# Patient Record
Sex: Male | Born: 1990 | Race: Black or African American | Hispanic: No | Marital: Single | Smoking: Never smoker
Health system: Southern US, Community
[De-identification: ages and names within clinical notes are randomized; demographics above are authoritative.]

---

## 2009-02-21 ENCOUNTER — Emergency Department (HOSPITAL_COMMUNITY): Admission: EM | Admit: 2009-02-21 | Discharge: 2009-02-21 | Payer: Self-pay | Admitting: Emergency Medicine

## 2009-05-17 ENCOUNTER — Emergency Department (HOSPITAL_COMMUNITY): Admission: EM | Admit: 2009-05-17 | Discharge: 2009-05-17 | Payer: Self-pay | Admitting: Emergency Medicine

## 2015-05-05 ENCOUNTER — Emergency Department (HOSPITAL_COMMUNITY)
Admission: EM | Admit: 2015-05-05 | Discharge: 2015-05-06 | Disposition: A | Discharge status: Home / Self Care | Payer: Self-pay | Attending: Emergency Medicine | Admitting: Emergency Medicine

## 2015-05-05 ENCOUNTER — Encounter (HOSPITAL_COMMUNITY): Payer: Self-pay | Admitting: *Deleted

## 2015-05-05 ENCOUNTER — Emergency Department (HOSPITAL_COMMUNITY): Payer: Self-pay

## 2015-05-05 DIAGNOSIS — Y92410 Unspecified street and highway as the place of occurrence of the external cause: Secondary | ICD-10-CM | POA: Insufficient documentation

## 2015-05-05 DIAGNOSIS — S81012A Laceration without foreign body, left knee, initial encounter: Secondary | ICD-10-CM | POA: Insufficient documentation

## 2015-05-05 DIAGNOSIS — Y998 Other external cause status: Secondary | ICD-10-CM | POA: Insufficient documentation

## 2015-05-05 DIAGNOSIS — S43102A Unspecified dislocation of left acromioclavicular joint, initial encounter: Secondary | ICD-10-CM | POA: Insufficient documentation

## 2015-05-05 DIAGNOSIS — T07XXXA Unspecified multiple injuries, initial encounter: Secondary | ICD-10-CM

## 2015-05-05 DIAGNOSIS — Y9389 Activity, other specified: Secondary | ICD-10-CM | POA: Insufficient documentation

## 2015-05-05 DIAGNOSIS — S51812A Laceration without foreign body of left forearm, initial encounter: Secondary | ICD-10-CM | POA: Insufficient documentation

## 2015-05-05 DIAGNOSIS — T148 Other injury of unspecified body region: Secondary | ICD-10-CM | POA: Insufficient documentation

## 2015-05-05 MED ORDER — FENTANYL CITRATE (PF) 100 MCG/2ML IJ SOLN
50.0000 ug | Freq: Once | INTRAMUSCULAR | Status: AC
  Administered 2015-05-05: 50 ug via NASAL
  Filled 2015-05-05: qty 2

## 2015-05-05 MED ORDER — OXYCODONE-ACETAMINOPHEN 5-325 MG PO TABS
1.0000 | ORAL_TABLET | Freq: Once | ORAL | Status: AC
  Administered 2015-05-05: 1 via ORAL
  Filled 2015-05-05: qty 1

## 2015-05-05 MED ORDER — NAPROXEN 375 MG PO TABS
375.0000 mg | ORAL_TABLET | Freq: Two times a day (BID) | ORAL | Status: DC
Start: 2015-05-05 — End: 2017-09-06

## 2015-05-05 MED ORDER — ONDANSETRON 4 MG PO TBDP
8.0000 mg | ORAL_TABLET | Freq: Once | ORAL | Status: AC
  Administered 2015-05-05: 8 mg via ORAL
  Filled 2015-05-05: qty 2

## 2015-05-05 MED ORDER — OXYCODONE-ACETAMINOPHEN 5-325 MG PO TABS: 1.0000 | ORAL_TABLET | Freq: Four times a day (QID) | ORAL | Status: DC | PRN
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
## 2015-05-05 NOTE — Discharge Instructions (Signed)
Shoulder Separation A shoulder separation (acromioclavicular separation) is an injury to the connecting tissue (ligament) between the top of your shoulder blade (acromion) and your collarbone (clavicle). The ligament may be stretched, partially torn, or completely torn.  A stretched ligament may not cause very much pain, and it does not move the collarbone out of place. A stretched ligament looks normal on an X-ray.  An injury that is a bit worse may partially tear a ligament and move the collarbone slightly out of place.  A serious injury completely tears both shoulder ligaments. This moves the collarbone severely out of position and changes the way that the shoulder looks (deformity). CAUSES The most common cause of a shoulder separation is falling on or receiving a blow to the top of the shoulder. Falling with an outstretched arm may also cause this injury. RISK FACTORS You may be at greater risk of a shoulder separation if:  You are male.  You are younger than age 72.  You play a contact sport, such as football or hockey. SIGNS AND SYMPTOMS The most common symptom of a shoulder separation is pain on the top of the shoulder after falling on it or receiving a blow to it. Other signs and symptoms include:  Shoulder deformity.  Swelling of the shoulder.  Decreased ability to move the shoulder.  Bruising on top of the shoulder. DIAGNOSIS Your health care provider may suspect a shoulder separation based on your symptoms and the details of a recent injury. A physical exam will be done. During this exam, the health care provider may:  Press on your shoulder.  Test the movement of your shoulder.  Ask you to hold a weight in your hand to see if the separation increases.  Do an X-ray. TREATMENT  A stretch injury may require only a sling, pain medicine, and cold packs. This treatment may last for 2-12 weeks. You may also have physical therapy. A physical therapist will teach you to  do daily exercises to strengthen your shoulder muscles and prevent stiffness.  A complete tear may require surgery to repair the torn ligament. After surgery, you will also require a sling, pain medicine, and cold packs. Recovery may take longer. You may also need more physical therapy. HOME CARE INSTRUCTIONS  Take medicines only as directed by your health care provider.  Apply ice to the top of your shoulder:  Put ice in a plastic bag.  Place a towel between your skin and the bag.  Leave the ice on for 20 minutes, 2-3 times a day.  Wear your sling or splint as directed by your health care provider.  You may be able to remove your sling to do your physical therapy exercises.  Ask your health care provider when you can stop wearing the sling.  Do not do any activities that make your pain worse.  Do not lift anything that is heavier than 10 lb (4.5 kg) on the injured side of your body.  Ask your health care provider when you can return to athletic activities. SEEK MEDICAL CARE IF:  Your pain medicine is not relieving your pain.  Your pain and stiffness are not improving after 2 weeks.  You are unable to do your physical therapy exercises because of pain or stiffness.   This information is not intended to replace advice given to you by your health care provider. Make sure you discuss any questions you have with your health care provider.   Document Released: 01/04/2005 Document Revised: 04/17/2014  Document Reviewed: 08/27/2013 Elsevier Interactive Patient Education 2016 [employer] Wound Care Taking care of your wound properly can help to prevent pain and infection. It can also help your wound to heal more quickly.  HOW TO CARE FOR YOUR WOUND Take or apply over-the-counter and prescription medicines only as told by your health care provider. If you were prescribed antibiotic medicine, take or apply it as told by your health care provider. Do not stop using the antibiotic  even if your condition improves. Clean the wound each day or as told by your health care provider. Wash the wound with mild soap and water. Rinse the wound with water to remove all soap. Pat the wound dry with a clean towel. Do not rub it. There are many different ways to close and cover a wound. For example, a wound can be covered with stitches (sutures), skin glue, or adhesive strips. Follow instructions from your health care provider about: How to take care of your wound. When and how you should change your bandage (dressing). When you should remove your dressing. Removing whatever was used to close your wound. Check your wound every day for signs of infection. Watch for: Redness, swelling, or pain. Fluid, blood, or pus. Keep the dressing dry until your health care provider says it can be removed. Do not take baths, swim, use a hot tub, or do anything that would put your wound underwater until your health care provider approves. Raise (elevate) the injured area above the level of your heart while you are sitting or lying down. Do not scratch or pick at the wound. Keep all follow-up visits as told by your health care provider. This is important. SEEK MEDICAL CARE IF: You received a tetanus shot and you have swelling, severe pain, redness, or bleeding at the injection site. You have a fever. Your pain is not controlled with medicine. You have increased redness, swelling, or pain at the site of your wound. You have fluid, blood, or pus coming from your wound. You notice a bad smell coming from your wound or your dressing. SEEK IMMEDIATE MEDICAL CARE IF: You have a red streak going away from your wound.   This information is not intended to replace advice given to you by your health care provider. Make sure you discuss any questions you have with your health care provider.   Document Released: 01/04/2008 Document Revised: 08/11/2014 Document Reviewed: 03/23/2014 Elsevier Interactive  Patient Education [employer]

## 2016-08-15 ENCOUNTER — Encounter (HOSPITAL_COMMUNITY): Payer: Self-pay | Admitting: Emergency Medicine

## 2016-08-15 ENCOUNTER — Emergency Department (HOSPITAL_COMMUNITY)
Admission: EM | Admit: 2016-08-15 | Discharge: 2016-08-15 | Disposition: A | Discharge status: Home / Self Care | Payer: Self-pay | Attending: Dermatology | Admitting: Dermatology

## 2016-08-15 DIAGNOSIS — Y9301 Activity, walking, marching and hiking: Secondary | ICD-10-CM | POA: Insufficient documentation

## 2016-08-15 DIAGNOSIS — M25571 Pain in right ankle and joints of right foot: Secondary | ICD-10-CM | POA: Insufficient documentation

## 2016-08-15 DIAGNOSIS — Y999 Unspecified external cause status: Secondary | ICD-10-CM | POA: Insufficient documentation

## 2016-08-15 DIAGNOSIS — Y929 Unspecified place or not applicable: Secondary | ICD-10-CM | POA: Insufficient documentation

## 2016-08-15 DIAGNOSIS — X509XXA Other and unspecified overexertion or strenuous movements or postures, initial encounter: Secondary | ICD-10-CM | POA: Insufficient documentation

## 2016-08-15 DIAGNOSIS — Z5321 Procedure and treatment not carried out due to patient leaving prior to being seen by health care provider: Secondary | ICD-10-CM | POA: Insufficient documentation

## 2016-08-15 NOTE — ED Triage Notes (Signed)
Pt. reports right ankle pain with mild swelling injured while walking today , pain increases with movement unrelieved by OTC Ibuprofen .
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
## 2016-09-25 ENCOUNTER — Ambulatory Visit (HOSPITAL_COMMUNITY)
Admission: EM | Admit: 2016-09-25 | Discharge: 2016-09-25 | Disposition: A | Discharge status: Home / Self Care | Attending: Family Medicine | Admitting: Family Medicine

## 2016-09-25 ENCOUNTER — Ambulatory Visit (INDEPENDENT_AMBULATORY_CARE_PROVIDER_SITE_OTHER)

## 2016-09-25 ENCOUNTER — Ambulatory Visit (HOSPITAL_COMMUNITY)

## 2016-09-25 ENCOUNTER — Encounter (HOSPITAL_COMMUNITY): Payer: Self-pay | Admitting: Emergency Medicine

## 2016-09-25 DIAGNOSIS — S93401A Sprain of unspecified ligament of right ankle, initial encounter: Secondary | ICD-10-CM | POA: Diagnosis not present

## 2016-09-25 DIAGNOSIS — W19XXXA Unspecified fall, initial encounter: Secondary | ICD-10-CM

## 2016-09-25 MED ORDER — IBUPROFEN 800 MG PO TABS: 800.0000 mg | ORAL_TABLET | Freq: Three times a day (TID) | ORAL | 0 refills | Status: AC

## 2016-09-25 NOTE — ED Triage Notes (Signed)
Pt reports he inverted his right ankle 3 weeks ago and since then, has been having pain upon wt bearing  A&O x4... NAD... Slow gait.

## 2016-09-25 NOTE — ED Provider Notes (Signed)
CSN: [id number]     Arrival date & time 09/25/16  1859 History     Chief Complaint  Patient presents with  . Ankle Injury    26 yo male comes in with right ankle pain after inverting ankle 3-4 weeks ago. Patient states he was running when he missed a step, inverted ankle and felt sharp pain. He has taken ibuprofen on and off for pain. Pain is not constant, usually induced by movement and weight bearing. Pain is around his ankle and denies radiation. Denies numbness and tingling.  He also experiences swelling. Denies erythema, increased warmth, fever.       History reviewed. No pertinent past medical history. History reviewed. No pertinent surgical history. History reviewed. No pertinent family history. Social History  Substance Use Topics  . Smoking status: Never Smoker  . Smokeless tobacco: Never Used  . Alcohol use Yes    Review of Systems  Constitutional: Negative for chills and fever.  Respiratory: Negative for cough, shortness of breath and wheezing.   Cardiovascular: Negative for chest pain and palpitations.  Musculoskeletal: Positive for arthralgias, joint swelling and myalgias.  Skin: Negative for wound.    Allergies  Patient has no known allergies.  Home Medications   Prior to Admission medications   Medication Sig Start Date End Date Taking? Authorizing Provider  ibuprofen (ADVIL,MOTRIN) 800 MG tablet Take 1 tablet (800 mg total) by mouth 3 (three) times daily. 09/25/16   Cathie HoopsYu, Obrian Bulson V, PA-C  naproxen (NAPROSYN) 375 MG tablet Take 1 tablet (375 mg total) by mouth 2 (two) times daily. 05/05/15   Arthor CaptainHarris, Abigail, PA-C  oxyCODONE-acetaminophen (PERCOCET/ROXICET) 5-325 MG tablet Take 1-2 tablets by mouth every 6 (six) hours as needed for severe pain. 05/05/15   Arthor CaptainHarris, Abigail, PA-C   Meds Ordered and Administered this Visit  Medications - No data to display  BP 130/63 (BP Location: Right Arm)   Pulse (!) 56   Temp 98.4 F (36.9 C) (Oral)   Resp 20   SpO2 100%  No  data found.   Physical Exam  Constitutional: He appears well-developed and well-nourished. No distress.  HENT:  Head: Normocephalic and atraumatic.  Eyes: Conjunctivae are normal. Pupils are equal, round, and reactive to light.  Cardiovascular: Normal rate and regular rhythm.  Exam reveals no gallop and no friction rub.   No murmur heard. Pulmonary/Chest: Effort normal and breath sounds normal.  Musculoskeletal:       Right knee: Normal.       Left knee: Normal.       Right ankle: He exhibits swelling. He exhibits normal range of motion and no ecchymosis. Tenderness.       Left ankle: Normal.  Sensation intact. No tenderness on palpation of right or left foot. Patient is able to bear weight on right ankle, but with pain.     Urgent Care Course     Procedures (including critical care time)  Labs Review Labs Reviewed - No data to display  Imaging Review Dg Ankle Complete Right  Result Date: 09/25/2016 CLINICAL DATA:  26 y/o M; internal rotation of the ankle 1 month ago with worsening pain. EXAM: RIGHT ANKLE - COMPLETE 3+ VIEW COMPARISON:  None. FINDINGS: There is no evidence of fracture, dislocation, or joint effusion. There is no evidence of arthropathy or other focal bone abnormality. Symmetric ankle mortise. Talar dome is intact. IMPRESSION: No acute fracture or dislocation identified. Electronically Signed   By: Mitzi HansenLance  Furusawa-Stratton M.D.   On: 09/25/2016 19:49  MDM   1. Sprain of right ankle, unspecified ligament, initial encounter    1. Reviewed imaging with patient. No evidence of fracture, dislocation or joint effusion. Discussed with patient history and exam most consistent with a sprain of the ankle. Start Ibuprofen 800mg  TID x 7 days. Start ice/heat compress. Ace wrap applied this visit. Patient to continue ace wrap as needed.     Belinda Fisher, PA-C 09/25/16 2012

## 2017-09-06 ENCOUNTER — Ambulatory Visit (HOSPITAL_COMMUNITY)
Admission: EM | Admit: 2017-09-06 | Discharge: 2017-09-06 | Disposition: A | Discharge status: Home / Self Care | Attending: Physician Assistant | Admitting: Physician Assistant

## 2017-09-06 ENCOUNTER — Encounter (HOSPITAL_COMMUNITY): Payer: Self-pay | Admitting: Emergency Medicine

## 2017-09-06 DIAGNOSIS — K529 Noninfective gastroenteritis and colitis, unspecified: Secondary | ICD-10-CM

## 2017-09-06 NOTE — ED Provider Notes (Signed)
09/06/2017 2:38 PM   DOB: 31-May-1990 / MRN: [id number]  SUBJECTIVE:  Frank Zhang is a 27 y.o. male presenting for diarrhea present over 1 month that started after a trip to the [place].  Patient went on campus Dr. receive antibiotics which seemed to help.  Denies any blood in stool and abdominal pain today.  No fevers, chills, nausea.  No GERD.  He has No Known Allergies.   He  has no past medical history on file.    He  reports that he has never smoked. He has never used smokeless tobacco. He reports that he drinks alcohol. He reports that he does not use drugs. He  has no sexual activity history on file. The patient  has no past surgical history on file.  His family history is not on file.  ROS per HPI  OBJECTIVE:  BP 133/87   Pulse 60   Temp 98.4 F (36.9 C)   Resp 18   SpO2 100%   Wt Readings from Last 3 Encounters:  08/15/16 167 lb (75.8 kg)  05/05/15 162 lb 9 oz (73.7 kg)   Temp Readings from Last 3 Encounters:  09/06/17 98.4 F (36.9 C)  09/25/16 98.4 F (36.9 C) (Oral)  08/15/16 98.4 F (36.9 C) (Oral)   BP Readings from Last 3 Encounters:  09/06/17 133/87  09/25/16 130/63  08/15/16 130/85   Pulse Readings from Last 3 Encounters:  09/06/17 60  09/25/16 (!) 56  08/15/16 72    Physical Exam  Constitutional: He is oriented to person, place, and time. He appears well-developed. He does not appear ill.  Eyes: Pupils are equal, round, and reactive to light. Conjunctivae and EOM are normal.  Cardiovascular: Normal rate, regular rhythm, S1 normal, S2 normal, normal heart sounds, intact distal pulses and normal pulses. Exam reveals no gallop and no friction rub.  No murmur heard. Pulmonary/Chest: Effort normal. He has no rales.  Abdominal: He exhibits no distension.  Musculoskeletal: Normal range of motion. He exhibits no edema.  Neurological: He is alert and oriented to person, place, and time. No cranial nerve deficit. Coordination normal.  Skin: Skin  is warm and dry. He is not diaphoretic.  Psychiatric: He has a normal mood and affect.  Nursing note and vitals reviewed.   No results found for this or any previous visit (from the past 72 hour(s)).  No results found.  ASSESSMENT AND PLAN:   Chronic diarrhea we will check a stool PCR and treat per those results.  Otherwise advised loperamide and adding fiber to his diet.     Discharge Instructions     Pick up some Imodium over-the-counter.  Take 1 tab in the morning and 1 tab at night.  Okay to take another tab midday if you need to.  Also pick up some Metamucil and begin taking 1 tablespoon in the morning and 1 tablespoon at night and at day 5 increase to 2 tablespoons in the morning and 2 tablespoons at night.  try to stop the Imodium after increasing the dose of Metamucil.        The patient is advised to call or return to clinic if he does not see an improvement in symptoms, or to seek the care of the closest emergency department if he worsens with the above plan.   Deliah Boston, MHS, PA-C 09/06/2017 2:38 PM   Ofilia Neas, PA-C 09/06/17 1438

## 2017-09-06 NOTE — ED Triage Notes (Signed)
Pt states he visited the [place] a few months ago and states he "got some sort of bug over there and I saw the doctor and he gave me some medicine for it". Pt has loose stool for the past couple of months. Denies pain.

## 2017-09-06 NOTE — Discharge Instructions (Signed)
Pick up some Imodium over-the-counter.  Take 1 tab in the morning and 1 tab at night.  Okay to take another tab midday if you need to.  Also pick up some Metamucil and begin taking 1 tablespoon in the morning and 1 tablespoon at night and at day 5 increase to 2 tablespoons in the morning and 2 tablespoons at night.  try to stop the Imodium after increasing the dose of Metamucil.

## 2017-09-07 ENCOUNTER — Telehealth (HOSPITAL_COMMUNITY): Payer: Self-pay

## 2017-09-07 LAB — GASTROINTESTINAL PANEL BY PCR, STOOL (REPLACES STOOL CULTURE)
ASTROVIRUS: NOT DETECTED
Adenovirus F40/41: NOT DETECTED
CAMPYLOBACTER SPECIES: NOT DETECTED
CYCLOSPORA CAYETANENSIS: NOT DETECTED
Cryptosporidium: NOT DETECTED
ENTEROTOXIGENIC E COLI (ETEC): NOT DETECTED
Entamoeba histolytica: NOT DETECTED
Enteroaggregative E coli (EAEC): NOT DETECTED
Enteropathogenic E coli (EPEC): NOT DETECTED
Giardia lamblia: NOT DETECTED
NOROVIRUS GI/GII: NOT DETECTED
PLESIMONAS SHIGELLOIDES: NOT DETECTED
Rotavirus A: NOT DETECTED
SAPOVIRUS (I, II, IV, AND V): NOT DETECTED
SHIGA LIKE TOXIN PRODUCING E COLI (STEC): NOT DETECTED
Salmonella species: NOT DETECTED
Shigella/Enteroinvasive E coli (EIEC): NOT DETECTED
VIBRIO SPECIES: NOT DETECTED
Vibrio cholerae: NOT DETECTED
Yersinia enterocolitica: NOT DETECTED

## 2017-09-07 NOTE — Telephone Encounter (Signed)
Results are within normal limits. Pt called and made aware.

## 2017-09-08 NOTE — Progress Notes (Signed)
No infectious organism.  He should continue the plan discussed in the office.  Follow up with PCP if problem continues.
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
## 2019-06-12 IMAGING — DX DG ANKLE COMPLETE 3+V*R*
3 series · 3 of 3 positions shown · non-contrast
Comparison: None.

CLINICAL DATA: 26 y/o M; internal rotation of the ankle 1 month ago
with worsening pain.

EXAM:
RIGHT ANKLE - COMPLETE 3+ VIEW

[ankle ap]
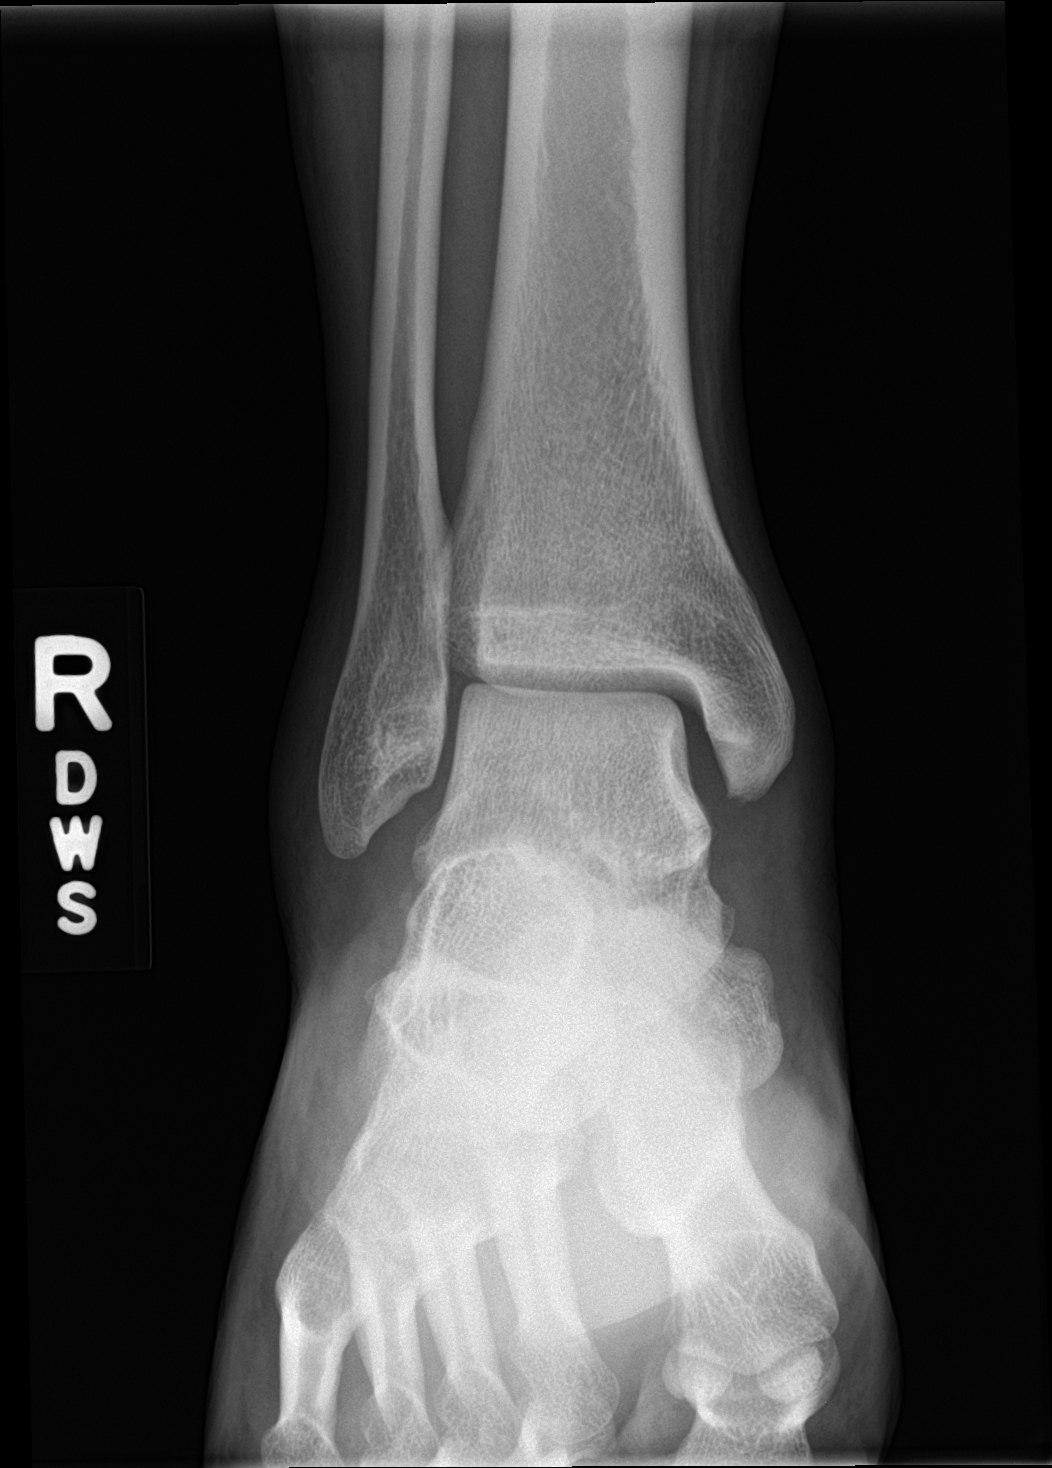

[ankle obl]
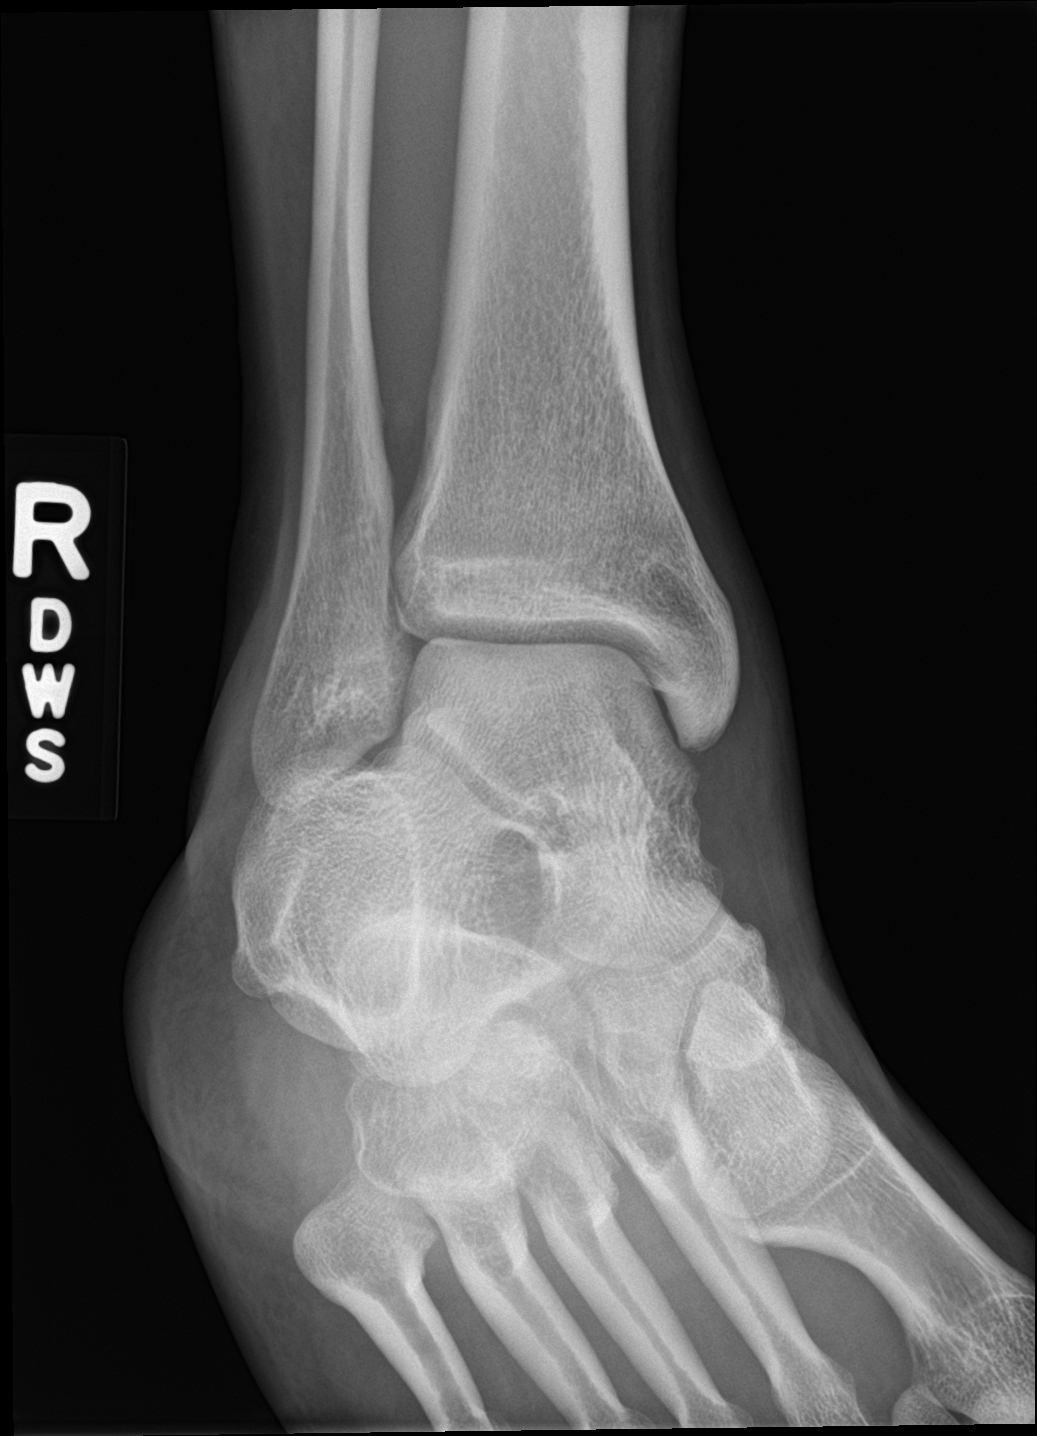

[ankle lat]
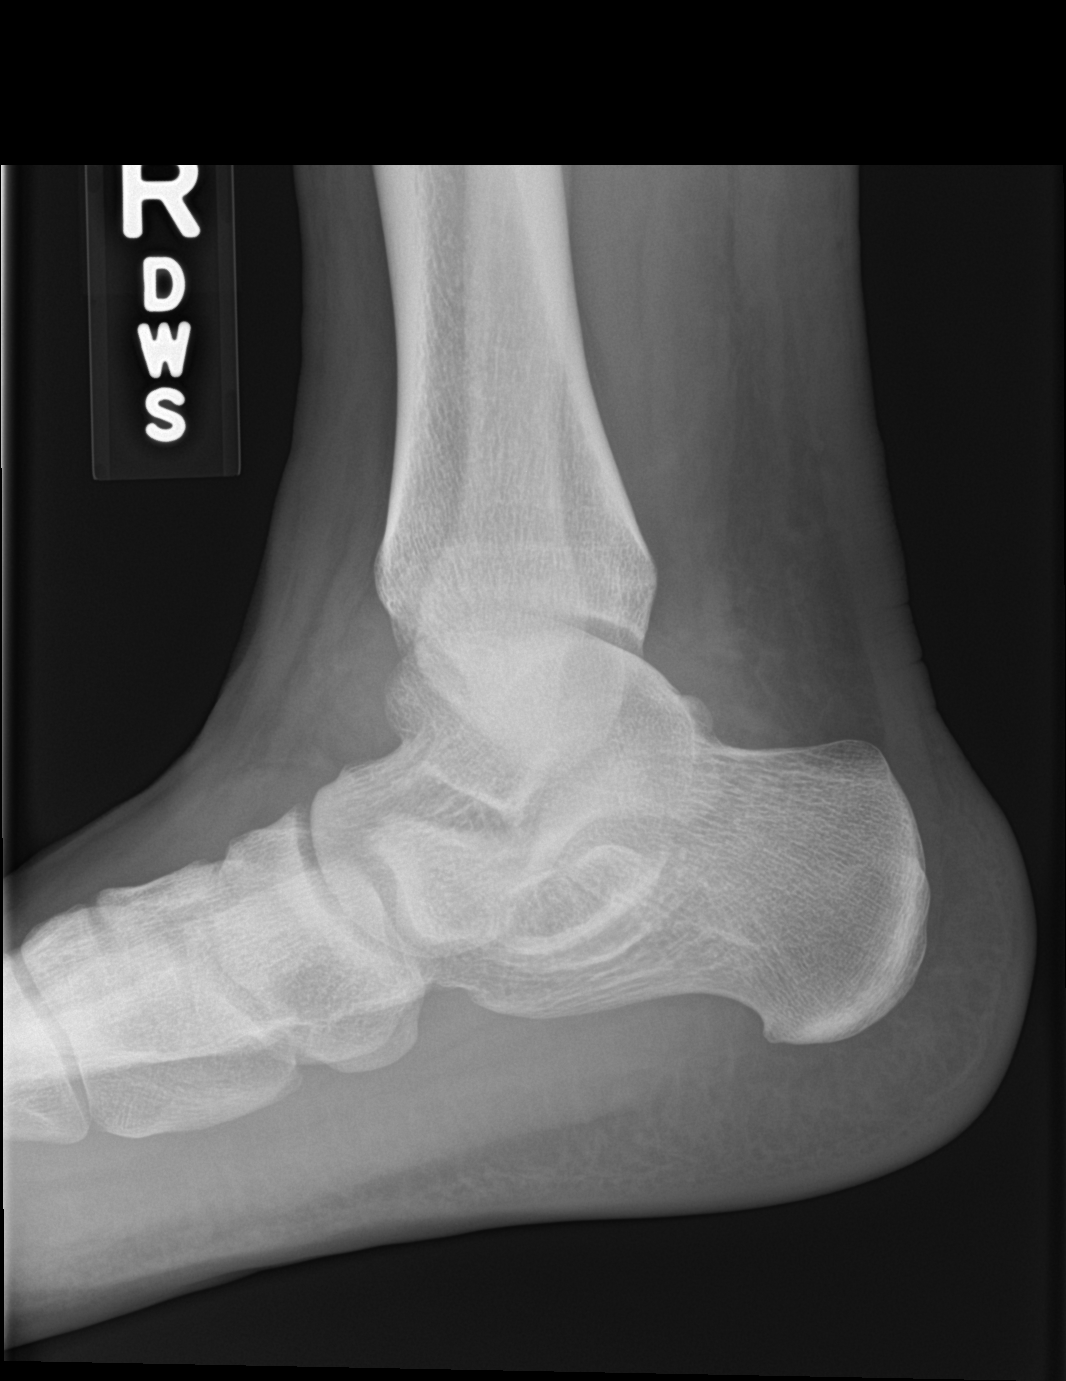

[3 of 3 positions shown; findings below may reference images not displayed]

FINDINGS: There is no evidence of fracture, dislocation, or joint effusion.
There is no evidence of arthropathy or other focal bone abnormality.
Symmetric ankle mortise. Talar dome is intact.
IMPRESSION: No acute fracture or dislocation identified.

By: Relindas Auad M.D.
